# Patient Record
Sex: Male | Born: 1980 | Race: White | Hispanic: No | Marital: Single | Smoking: Current some day smoker
Health system: Southern US, Community
[De-identification: ages and names within clinical notes are randomized; demographics above are authoritative.]

---
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
## 2011-07-20 ENCOUNTER — Ambulatory Visit: Payer: Self-pay | Admitting: Family Medicine

## 2013-09-06 ENCOUNTER — Ambulatory Visit (INDEPENDENT_AMBULATORY_CARE_PROVIDER_SITE_OTHER): Payer: Self-pay

## 2013-09-06 ENCOUNTER — Ambulatory Visit (INDEPENDENT_AMBULATORY_CARE_PROVIDER_SITE_OTHER): Payer: Self-pay | Admitting: Family Medicine

## 2013-09-06 VITALS — BP 118/72 | HR 75 | Temp 98.0°F | Resp 17 | Ht 70.0 in | Wt 156.0 lb

## 2013-09-06 DIAGNOSIS — M25529 Pain in unspecified elbow: Secondary | ICD-10-CM

## 2013-09-06 DIAGNOSIS — M25521 Pain in right elbow: Secondary | ICD-10-CM

## 2013-09-06 NOTE — Patient Instructions (Addendum)
Ibuprofen for pain  Ice if it swells  Return if problems
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
## 2013-09-06 NOTE — Addendum Note (Signed)
Addended by: Jolan Mealor H on: 09/06/2013 03:43 PM   Modules accepted: Level of Service

## 2015-05-23 IMAGING — CR DG FOREARM 2V*R*
2 series · 2 of 2 positions shown · non-contrast
Comparison: None.

CLINICAL DATA: Right forearm pain following injury

EXAM:
RIGHT FOREARM - 2 VIEW

[AP]
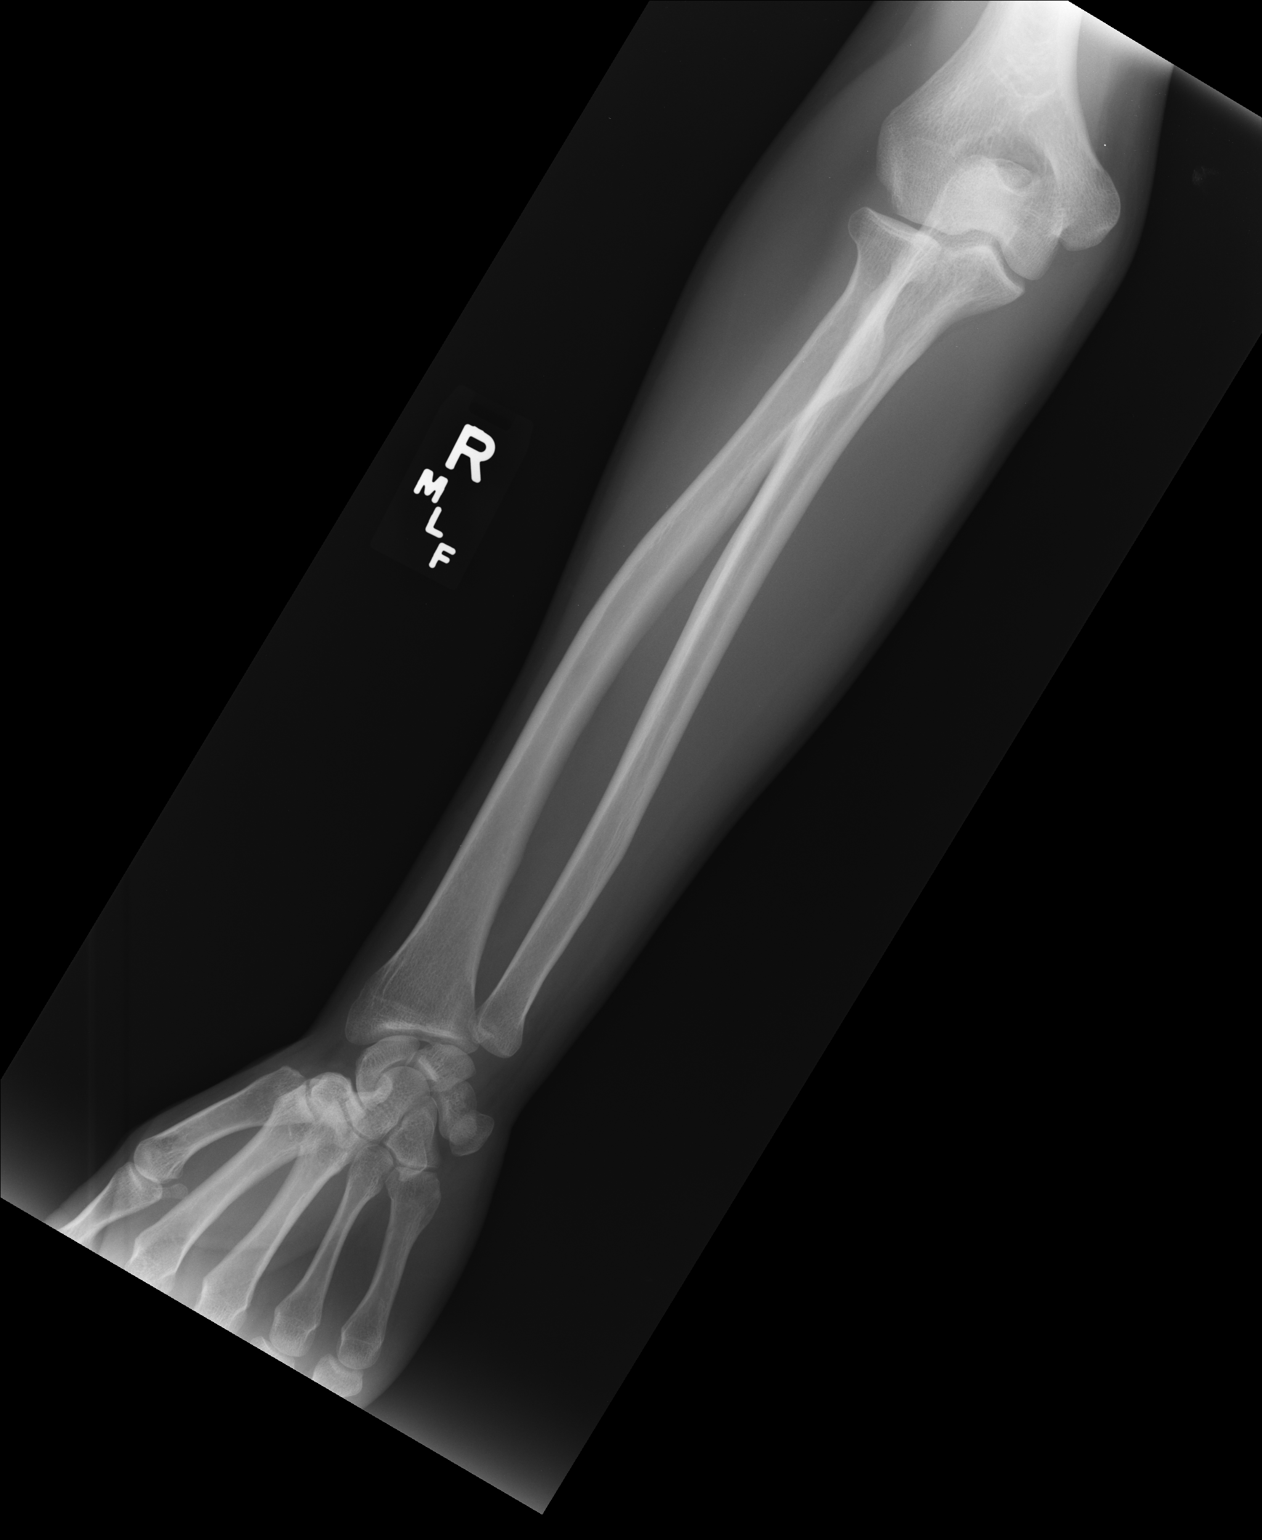

[lateral]
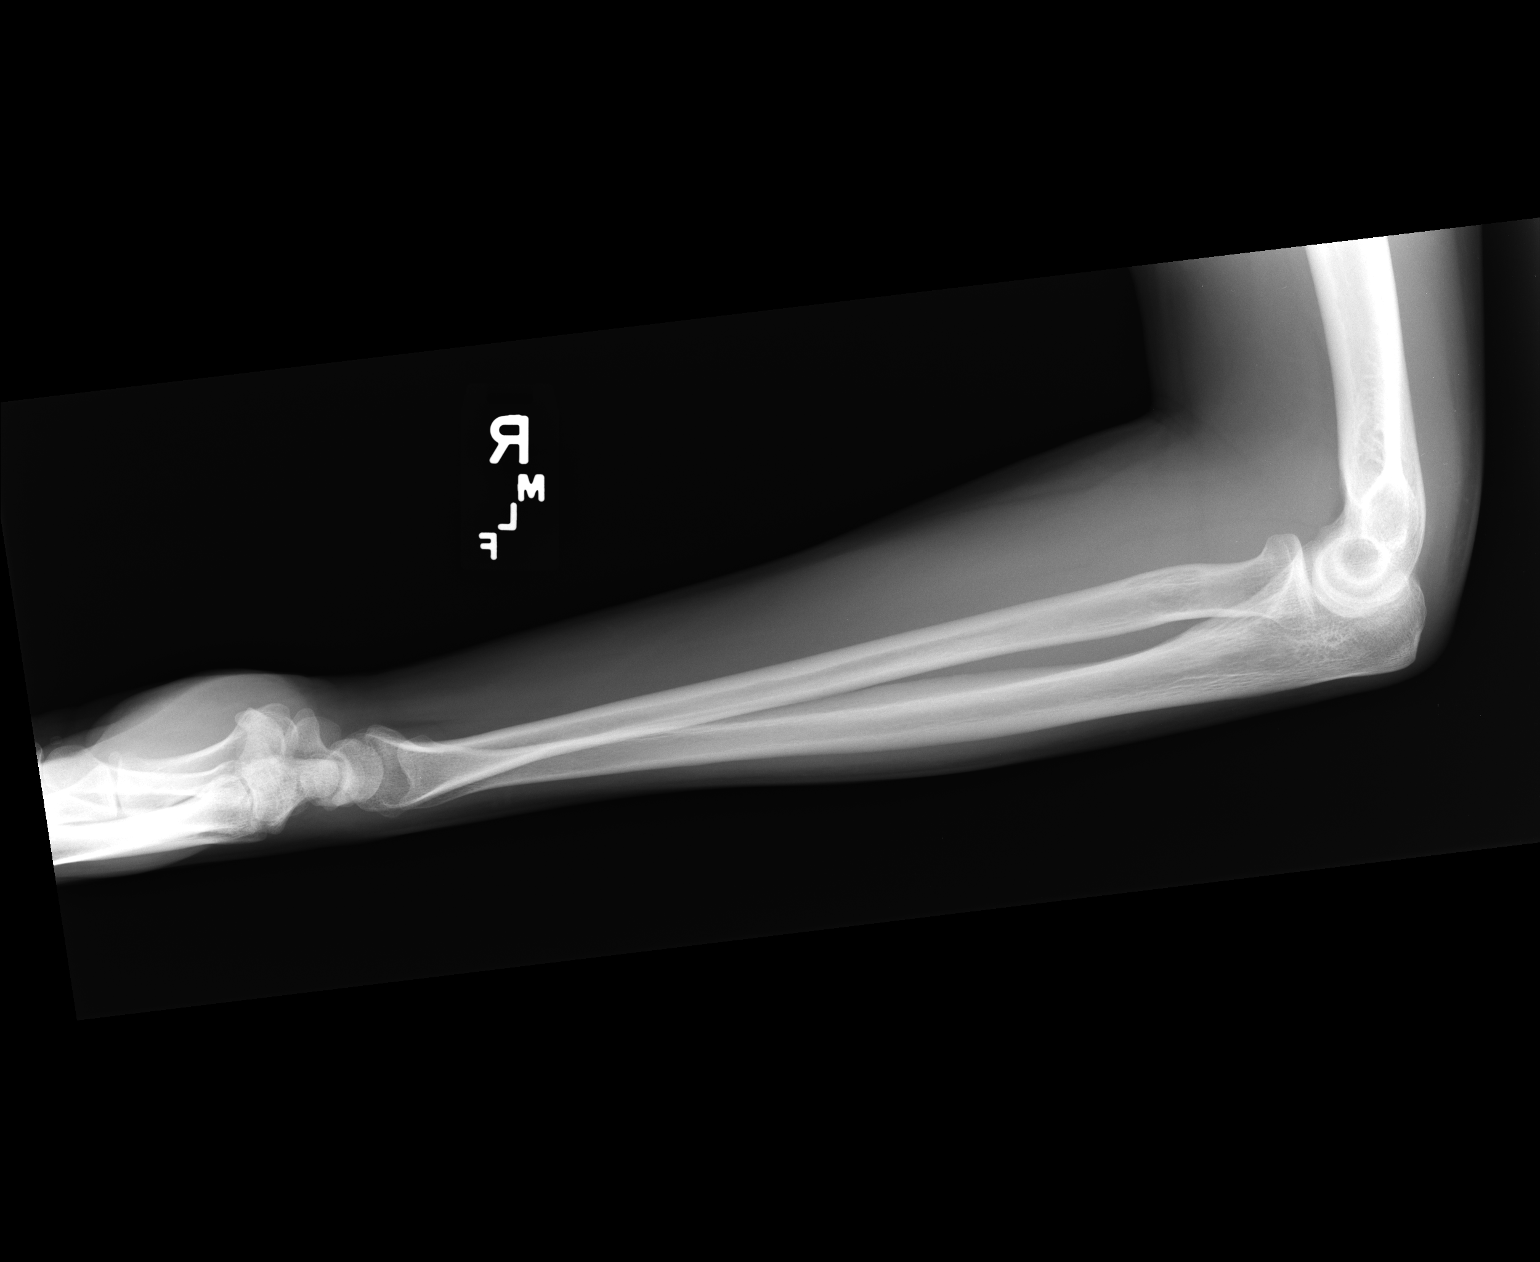

[2 of 2 positions shown; findings below may reference images not displayed]

FINDINGS: There is no evidence of fracture or other focal bone lesions. Soft
tissues are unremarkable.
IMPRESSION: No acute abnormality noted.

## 2019-05-17 ENCOUNTER — Ambulatory Visit: Payer: Self-pay | Attending: Internal Medicine

## 2019-05-17 DIAGNOSIS — Z23 Encounter for immunization: Secondary | ICD-10-CM

## 2019-05-17 NOTE — Progress Notes (Signed)
   Covid-19 Vaccination [facility]  Name:  [name]    MRN: [id number] DOB: [birth year]  05/17/2019  Mr. Schwieger was observed post Covid-19 immunization for 15 minutes without incident. He was provided with Vaccine Information Sheet and instruction to access the V-Safe system.   Mr. Hern was instructed to call 911 with any severe reactions post vaccine: Marland Kitchen Difficulty breathing  . Swelling of face and throat  . A fast heartbeat  . A bad rash all over body  . Dizziness and weakness   Immunizations Administered    Name Date Dose VIS Date Route   Pfizer COVID-19 Vaccine 05/17/2019  8:43 AM 0.3 mL 02/08/2019 Intramuscular   Manufacturer: [employer], [employer]   Lot: [id number]   NDC: [address]-4

## 2019-06-11 ENCOUNTER — Ambulatory Visit: Payer: Self-pay | Attending: Internal Medicine

## 2019-06-11 DIAGNOSIS — Z23 Encounter for immunization: Secondary | ICD-10-CM

## 2019-06-11 NOTE — Progress Notes (Signed)
   Covid-19 Vaccination Clinic  Name:  Daniel Cowan    MRN: [id number] DOB: [birth year]  06/11/2019  Mr. Daniel Cowan was observed post Covid-19 immunization for 15 minutes without incident. He was provided with Vaccine Information Sheet and instruction to access the V-Safe system.   Mr. Daniel Cowan was instructed to call 911 with any severe reactions post vaccine: Marland Kitchen Difficulty breathing  . Swelling of face and throat  . A fast heartbeat  . A bad rash all over body  . Dizziness and weakness   Immunizations Administered    Name Date Dose VIS Date Route   Pfizer COVID-19 Vaccine 06/11/2019  9:26 AM 0.3 mL 02/08/2019 Intramuscular   Manufacturer: [employer], [employer]   Lot: [id number]   NDC: [address]-2
